# Patient Record
Sex: Male | Born: 1960 | Race: Black or African American | Hispanic: No | Marital: Married | Smoking: Light tobacco smoker
Health system: Southern US, Community
[De-identification: ages and names within clinical notes are randomized; demographics above are authoritative.]

---

## 2018-04-21 ENCOUNTER — Encounter (HOSPITAL_COMMUNITY): Payer: Self-pay | Admitting: Nurse Practitioner

## 2018-04-21 ENCOUNTER — Emergency Department (HOSPITAL_COMMUNITY)
Admission: EM | Admit: 2018-04-21 | Discharge: 2018-04-22 | Disposition: A | Discharge status: Home / Self Care | Payer: Self-pay | Attending: Emergency Medicine | Admitting: Emergency Medicine

## 2018-04-21 DIAGNOSIS — N390 Urinary tract infection, site not specified: Secondary | ICD-10-CM | POA: Insufficient documentation

## 2018-04-21 DIAGNOSIS — R310 Gross hematuria: Secondary | ICD-10-CM | POA: Insufficient documentation

## 2018-04-21 DIAGNOSIS — F17200 Nicotine dependence, unspecified, uncomplicated: Secondary | ICD-10-CM | POA: Insufficient documentation

## 2018-04-21 LAB — COMPREHENSIVE METABOLIC PANEL
ALBUMIN: 3.6 g/dL (ref 3.5–5.0)
ALT: 36 U/L (ref 17–63)
AST: 21 U/L (ref 15–41)
Alkaline Phosphatase: 60 U/L (ref 38–126)
Anion gap: 8 (ref 5–15)
BUN: 18 mg/dL (ref 6–20)
CHLORIDE: 104 mmol/L (ref 101–111)
CO2: 26 mmol/L (ref 22–32)
Calcium: 8.9 mg/dL (ref 8.9–10.3)
Creatinine, Ser: 1.06 mg/dL (ref 0.61–1.24)
GFR calc Af Amer: >60 mL/min (ref >60)
Glucose, Bld: 110 mg/dL — ABNORMAL HIGH (ref 65–99)
POTASSIUM: 3.3 mmol/L — AB (ref 3.5–5.1)
SODIUM: 138 mmol/L (ref 135–145)
Total Bilirubin: 0.3 mg/dL (ref 0.3–1.2)
Total Protein: 7.4 g/dL (ref 6.5–8.1)

## 2018-04-21 LAB — CBC WITH DIFFERENTIAL/PLATELET
BASOS ABS: 0 10*3/uL (ref 0.0–0.1)
BASOS PCT: 0 %
EOS ABS: 0.5 10*3/uL (ref 0.0–0.7)
EOS PCT: 5 %
HCT: 38.3 % — ABNORMAL LOW (ref 39.0–52.0)
Hemoglobin: 12.9 g/dL — ABNORMAL LOW (ref 13.0–17.0)
Lymphocytes Relative: 15 %
Lymphs Abs: 1.6 10*3/uL (ref 0.7–4.0)
MCH: 29.5 pg (ref 26.0–34.0)
MCHC: 33.7 g/dL (ref 30.0–36.0)
MCV: 87.6 fL (ref 78.0–100.0)
Monocytes Absolute: 1.1 10*3/uL — ABNORMAL HIGH (ref 0.1–1.0)
Monocytes Relative: 10 %
Neutro Abs: 7.3 10*3/uL (ref 1.7–7.7)
Neutrophils Relative %: 70 %
PLATELETS: 309 10*3/uL (ref 150–400)
RBC: 4.37 MIL/uL (ref 4.22–5.81)
RDW: 13.3 % (ref 11.5–15.5)
WBC: 10.4 10*3/uL (ref 4.0–10.5)

## 2018-04-21 LAB — URINALYSIS, ROUTINE W REFLEX MICROSCOPIC

## 2018-04-21 LAB — URINALYSIS, MICROSCOPIC (REFLEX): WBC, UA: >50 WBC/hpf (ref 0–5)

## 2018-04-21 MED ORDER — CEPHALEXIN 500 MG PO CAPS: 500.0000 mg | ORAL_CAPSULE | Freq: Four times a day (QID) | ORAL | 0 refills | Status: AC

## 2018-04-21 NOTE — Discharge Instructions (Signed)
It was my pleasure taking care of you today!   Please take all of your antibiotics until finished!  It is very important that you call the urology clinic in the morning and schedule a follow up appointment. Let them know that you had a large amount of blood in your urine and were told to follow up with them for further evaluation of this.   Return to ER for fever, abdominal pain, vomiting, new or worsening symptoms, any additional concerns.

## 2018-04-21 NOTE — ED Triage Notes (Signed)
Pt state he notices a copious amount of blood in his urine earlier today. Denies dysuria or genitourinary problems.

## 2018-04-21 NOTE — ED Provider Notes (Signed)
[facility]-EMERGENCY DEPT Provider Note   CSN: [id number] Arrival date & time: 04/21/18  2013     History   Chief Complaint Chief Complaint  Patient presents with  . Hematuria    HPI Justin Bray is a 57 y.o. male.  The history is provided by the patient and medical records. No language interpreter was used.  Hematuria    Justin Bray is a 57 y.o. male  with no known past medical history who presents to the Emergency Department complaining of hematuria that he first noticed last night.  He denies any associated pain, dysuria, urinary urgency frequency.  No nausea or vomiting.  No fevers.  He states that he went to the bathroom and noticed bright red blood in his urine that felt the toilet bowl.  There were no clots.  He thought it might go away on its own.  Throughout the day today, he had blood with each episode of urination, therefore came to the emergency department to seek further care.  No medications taken prior to arrival for symptoms.  No history of similar.  Not on any anticoagulation.  History reviewed. No pertinent past medical history.  There are no active problems to display for this patient.   History reviewed. No pertinent surgical history.      Home Medications    Prior to Admission medications   Medication Sig Start Date End Date Taking? Authorizing Provider  famotidine-calcium carbonate-magnesium hydroxide (PEPCID COMPLETE) 10-800-165 MG chewable tablet Chew 1 tablet by mouth daily as needed (acid reflux).   Yes [provider]  cephALEXin (KEFLEX) 500 MG capsule Take 1 capsule (500 mg total) by mouth 4 (four) times daily. 04/21/18   Donyale Falcon, Chase PicketJaime Pilcher, PA-C    Family History History reviewed. No pertinent family history.  Social History Social History   Tobacco Use  . Smoking status: Light Tobacco Smoker  . Smokeless tobacco: Never Used  Substance Use Topics  . Alcohol use: Not on file  . Drug use: Not on file      Allergies   Patient has no known allergies.   Review of Systems Review of Systems  Genitourinary: Positive for hematuria. Negative for difficulty urinating, discharge, dysuria, flank pain, frequency, penile pain, penile swelling, scrotal swelling, testicular pain and urgency.  All other systems reviewed and are negative.    Physical Exam Updated Vital Signs BP 107/70   Pulse 97   Temp 98.8 F (37.1 C) (Oral)   Resp 16   Ht 5\' 10"  (1.778 m)   Wt 88.4 kg (194 lb 12.8 oz)   SpO2 97%   BMI 27.95 kg/m   Physical Exam  Constitutional: He is oriented to person, place, and time. He appears well-developed and well-nourished. No distress.  HENT:  Head: Normocephalic and atraumatic.  Neck: Neck supple.  Cardiovascular: Normal rate, regular rhythm and normal heart sounds.  No murmur heard. Pulmonary/Chest: Effort normal and breath sounds normal. No respiratory distress.  Abdominal: Soft. He exhibits no distension.  No abdominal, flank or CVA tenderness.  Neurological: He is alert and oriented to person, place, and time.  Skin: Skin is warm and dry.  Nursing note and vitals reviewed.    ED Treatments / Results  Labs (all labs ordered are listed, but only abnormal results are displayed) Labs Reviewed  URINALYSIS, ROUTINE W REFLEX MICROSCOPIC - Abnormal; Notable for the following components:      Result Value   Color, Urine RED (*)    APPearance TURBID (*)  Glucose, UA   (*)    Value: TEST NOT REPORTED DUE TO COLOR INTERFERENCE OF URINE PIGMENT   Hgb urine dipstick   (*)    Value: TEST NOT REPORTED DUE TO COLOR INTERFERENCE OF URINE PIGMENT   Bilirubin Urine   (*)    Value: TEST NOT REPORTED DUE TO COLOR INTERFERENCE OF URINE PIGMENT   Ketones, ur   (*)    Value: TEST NOT REPORTED DUE TO COLOR INTERFERENCE OF URINE PIGMENT   Protein, ur   (*)    Value: TEST NOT REPORTED DUE TO COLOR INTERFERENCE OF URINE PIGMENT   Nitrite   (*)    Value: TEST NOT REPORTED DUE TO  COLOR INTERFERENCE OF URINE PIGMENT   Leukocytes, UA   (*)    Value: TEST NOT REPORTED DUE TO COLOR INTERFERENCE OF URINE PIGMENT   All other components within normal limits  CBC WITH DIFFERENTIAL/PLATELET - Abnormal; Notable for the following components:   Hemoglobin 12.9 (*)    HCT 38.3 (*)    Monocytes Absolute 1.1 (*)    All other components within normal limits  COMPREHENSIVE METABOLIC PANEL - Abnormal; Notable for the following components:   Potassium 3.3 (*)    Glucose, Bld 110 (*)    All other components within normal limits  URINALYSIS, MICROSCOPIC (REFLEX) - Abnormal; Notable for the following components:   Bacteria, UA FEW (*)    All other components within normal limits  URINE CULTURE    EKG None  Radiology No results found.  Procedures Procedures (including critical care time)  Medications Ordered in ED Medications - No data to display   Initial Impression / Assessment and Plan / ED Course  I have reviewed the triage vital signs and the nursing notes.  Pertinent labs & imaging results that were available during my care of the patient were reviewed by me and considered in my medical decision making (see chart for details).    Justin Bray is a 57 y.o. male who presents to ED for pain with hematuria which he noticed yesterday and has continued throughout the day today.  No abdominal, flank or CVA tenderness.  He is afebrile and hemodynamically stable.  Normal kidney function.  Hemoglobin stable.  UA difficult to interpret due to pigmentation, however microscopy does show greater than 50 WBCs and greater than 50 RBCs.  Urine sent for culture.  Will treat with Keflex.  Discussed with patient and his wife at length about the importance of urology follow-up for further evaluation of gross painless hematuria.  Wife agrees to call urology in the morning to schedule appointment.  Referral information provided.  Reasons to return to the ER were discussed and included in  discharge paperwork.  All questions answered.  Patient discussed with Dr. Deretha Emory who agrees with treatment plan.   Final Clinical Impressions(s) / ED Diagnoses   Final diagnoses:  Lower urinary tract infectious disease  Gross hematuria    ED Discharge Orders        Ordered    cephALEXin (KEFLEX) 500 MG capsule  4 times daily     04/21/18 2311       Lexie Koehl, Chase Picket, PA-C 04/21/18 2319    Vanetta Mulders, MD 04/24/18 2048

## 2018-04-23 LAB — URINE CULTURE: Culture: NO GROWTH

## 2022-03-24 ENCOUNTER — Other Ambulatory Visit: Payer: Self-pay

## 2022-03-24 ENCOUNTER — Encounter (HOSPITAL_BASED_OUTPATIENT_CLINIC_OR_DEPARTMENT_OTHER): Payer: Self-pay | Admitting: Emergency Medicine

## 2022-03-24 ENCOUNTER — Emergency Department (HOSPITAL_BASED_OUTPATIENT_CLINIC_OR_DEPARTMENT_OTHER)
Admission: EM | Admit: 2022-03-24 | Discharge: 2022-03-24 | Disposition: A | Discharge status: Home / Self Care | Payer: Managed Care, Other (non HMO) | Attending: Emergency Medicine | Admitting: Emergency Medicine

## 2022-03-24 ENCOUNTER — Emergency Department (HOSPITAL_BASED_OUTPATIENT_CLINIC_OR_DEPARTMENT_OTHER): Payer: Managed Care, Other (non HMO)

## 2022-03-24 DIAGNOSIS — M25571 Pain in right ankle and joints of right foot: Secondary | ICD-10-CM | POA: Diagnosis present

## 2022-03-24 MED ORDER — IBUPROFEN 600 MG PO TABS: 600.0000 mg | ORAL_TABLET | Freq: Four times a day (QID) | ORAL | 0 refills | Status: AC | PRN

## 2022-03-24 MED ORDER — OXYCODONE HCL 5 MG PO TABS: 5.0000 mg | ORAL_TABLET | Freq: Four times a day (QID) | ORAL | 0 refills | Status: AC | PRN

## 2022-03-24 MED ORDER — HYDROCODONE-ACETAMINOPHEN 5-325 MG PO TABS
1.0000 | ORAL_TABLET | Freq: Once | ORAL | Status: AC
  Administered 2022-03-24: 1 via ORAL
  Filled 2022-03-24: qty 1

## 2022-03-24 NOTE — ED Notes (Signed)
Patient verbalizes understanding of discharge instructions. Opportunity for questioning and answers were provided. Patient discharged from ED.  °

## 2022-03-24 NOTE — ED Provider Notes (Signed)
[facility] EMERGENCY DEPT Provider Note   CSN: [id number] Arrival date & time: 03/24/22  3875     History  Chief Complaint  Patient presents with   Ankle Pain    Justin Bray is a 61 y.o. male.  Pt is a 61 yo male presenting for right sided ankle pain. Pt states he tripped and fell down two steps yesterday injurying the outside of his right ankle. Denies any motor deficits or sensation deficits. Denies head trauma, LOC, or blood thinner use.   The history is provided by the patient. No language interpreter was used.  Ankle Pain Associated symptoms: no back pain and no fever       Home Medications Prior to Admission medications   Medication Sig Start Date End Date Taking? Authorizing Provider  ibuprofen (ADVIL) 600 MG tablet Take 1 tablet (600 mg total) by mouth every 6 (six) hours as needed. 03/24/22  Yes Edwin Dada P, DO  oxyCODONE (ROXICODONE) 5 MG immediate release tablet Take 1 tablet (5 mg total) by mouth every 6 (six) hours as needed for severe pain. 03/24/22  Yes Edwin Dada P, DO  cephALEXin (KEFLEX) 500 MG capsule Take 1 capsule (500 mg total) by mouth 4 (four) times daily. 04/21/18   Ward, Chase Picket, PA-C  famotidine-calcium carbonate-magnesium hydroxide (PEPCID COMPLETE) 10-800-165 MG chewable tablet Chew 1 tablet by mouth daily as needed (acid reflux).    [provider]      Allergies    Patient has no known allergies.    Review of Systems   Review of Systems  Constitutional:  Negative for chills and fever.  HENT:  Negative for ear pain and sore throat.   Eyes:  Negative for pain and visual disturbance.  Respiratory:  Negative for cough and shortness of breath.   Cardiovascular:  Negative for chest pain and palpitations.  Gastrointestinal:  Negative for abdominal pain and vomiting.  Genitourinary:  Negative for dysuria and hematuria.  Musculoskeletal:  Negative for arthralgias and back pain.  Skin:  Negative for color change and  rash.  Neurological:  Negative for seizures and syncope.  All other systems reviewed and are negative.  Physical Exam Updated Vital Signs BP (!) 142/94 (BP Location: Right Arm)   Pulse 78   Temp 98.1 F (36.7 C) (Oral)   Resp 18   Ht 5\' 10"  (1.778 m)   Wt 91.2 kg   SpO2 100%   BMI 28.84 kg/m  Physical Exam Vitals and nursing note reviewed.  Constitutional:      Appearance: Normal appearance.  Cardiovascular:     Rate and Rhythm: Normal rate and regular rhythm.     Pulses:          Dorsalis pedis pulses are 2+ on the right side and 2+ on the left side.  Pulmonary:     Effort: Pulmonary effort is normal.     Breath sounds: Normal breath sounds.  Skin:    Capillary Refill: Capillary refill takes less than 2 seconds.  Neurological:     General: No focal deficit present.     Mental Status: He is alert.     GCS: GCS eye subscore is 4. GCS verbal subscore is 5. GCS motor subscore is 6.     Sensory: Sensation is intact.     Motor: Motor function is intact.    ED Results / Procedures / Treatments   Labs (all labs ordered are listed, but only abnormal results are displayed) Labs Reviewed - No  data to display  EKG None  Radiology DG Ankle Complete Right  Result Date: 03/24/2022 CLINICAL DATA:  Right ankle pain after fall. EXAM: RIGHT ANKLE - COMPLETE 3+ VIEW COMPARISON:  None Available. FINDINGS: Minimal degenerative changes of the ankle mortise. No acute fracture or dislocation. No focal soft tissue abnormality. Minimal degenerate change over the dorsal talonavicular joint. Small inferior calcaneal spur. IMPRESSION: No acute findings. Electronically Signed   By: Elberta Fortis M.D.   On: 03/24/2022 08:28    Procedures Procedures    Medications Ordered in ED Medications  HYDROcodone-acetaminophen (NORCO/VICODIN) 5-325 MG per tablet 1 tablet (1 tablet Oral Given 03/24/22 0177)    ED Course/ Medical Decision Making/ A&P                           Medical Decision  Making Amount and/or Complexity of Data Reviewed Radiology: ordered.  Risk Prescription drug management.   8:38 AM 61 yo male presenting for right sided ankle pain.  Patient is alert and oriented x3, no acute distress, afebrile, with stable vital signs.  Physical exam demonstrates swelling of the right lateral malleolus.  Tenderness to palpation.  Leg neurovascularly intact.  No other signs of trauma.  X-ray demonstrates no fractures.  Medication given for pain control, Ace wrap applied, and crutches given with teaching.  Patient recommended for rest, elevation, and ice.  Motrin and Tylenol for minor pain and Norco for severe pain.  Patient in no distress and overall condition improved here in the ED. Detailed discussions were had with the patient regarding current findings, and need for close f/u with orthopedic specialist. The patient has been instructed to return immediately if the symptoms worsen in any way for re-evaluation. Patient verbalized understanding and is in agreement with current care plan. All questions answered prior to discharge.         Final Clinical Impression(s) / ED Diagnoses Final diagnoses:  Acute right ankle pain    Rx / DC Orders ED Discharge Orders          Ordered    ibuprofen (ADVIL) 600 MG tablet  Every 6 hours PRN        03/24/22 0837    oxyCODONE (ROXICODONE) 5 MG immediate release tablet  Every 6 hours PRN        03/24/22 0837              Franne Forts, DO 03/24/22 [phone]

## 2022-03-24 NOTE — ED Triage Notes (Signed)
Pt had a mechanical fall yesterday as he was going down the stairs. Pt fell to his knees and then onto his right elbow. Pt co/o pain to his right ankle.

## 2022-03-24 NOTE — Discharge Instructions (Signed)
Rest, elevated leg, ice, and Motrin/Tylenol for pain and inflammation. Oxycodone sent to pharmacy for severe pain. Use crutches as needed. Follow up with orthopedics if pain does not resolve in 1-2 weeks of rest.

## 2023-05-12 IMAGING — DX DG ANKLE COMPLETE 3+V*R*
3 series · 3 of 3 positions shown · non-contrast
Comparison: None Available.

CLINICAL DATA: Right ankle pain after fall.

EXAM:
RIGHT ANKLE - COMPLETE 3+ VIEW

[ankle ap]
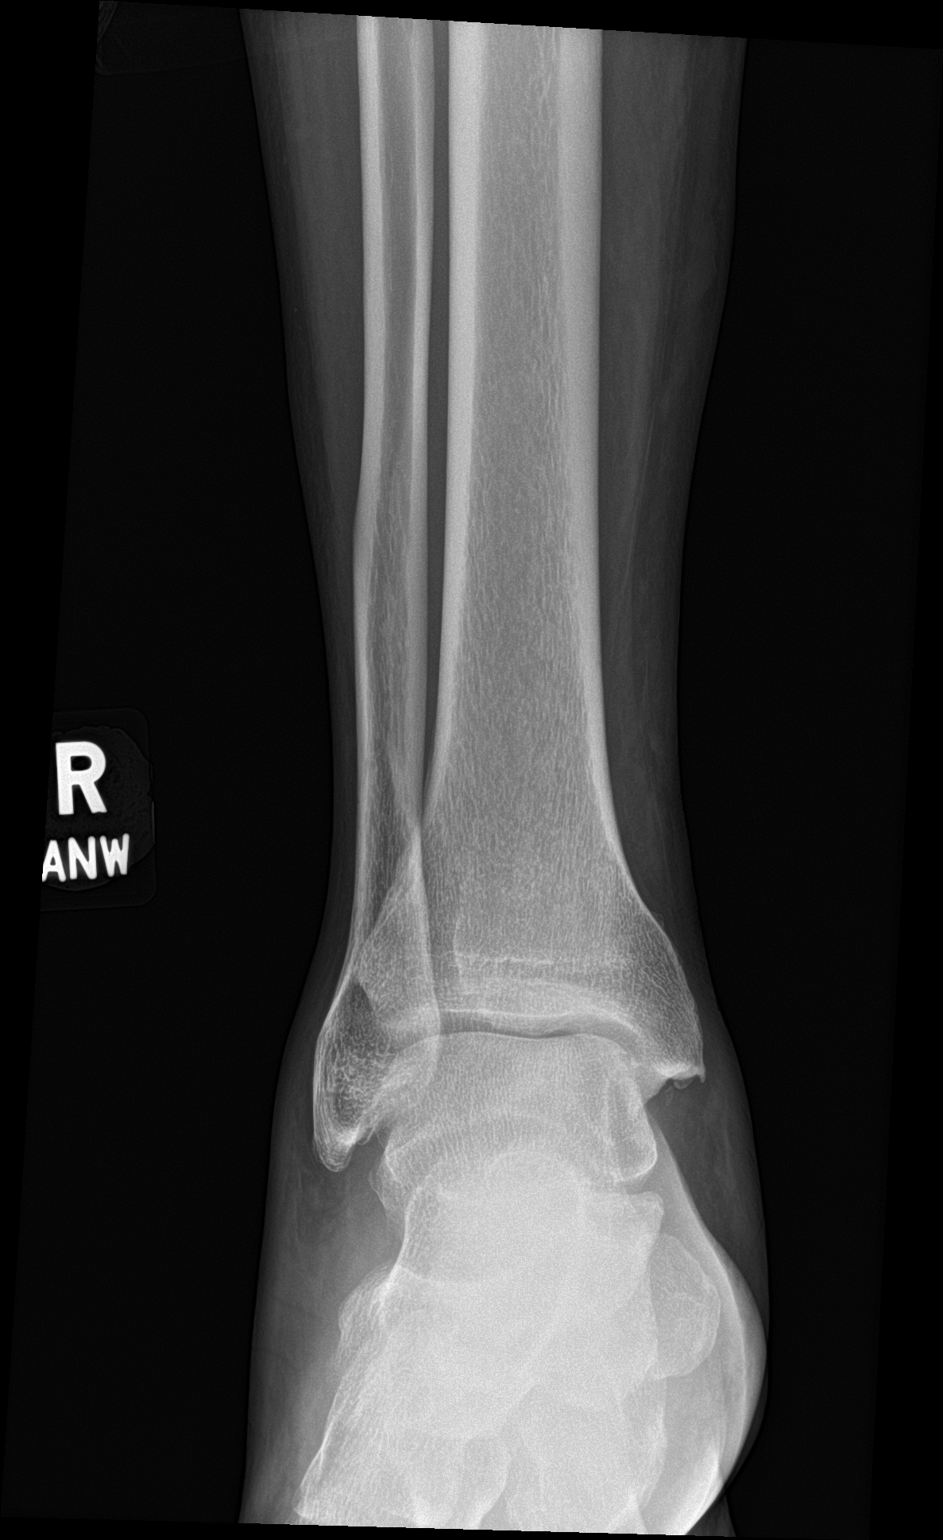

[ankle obl]
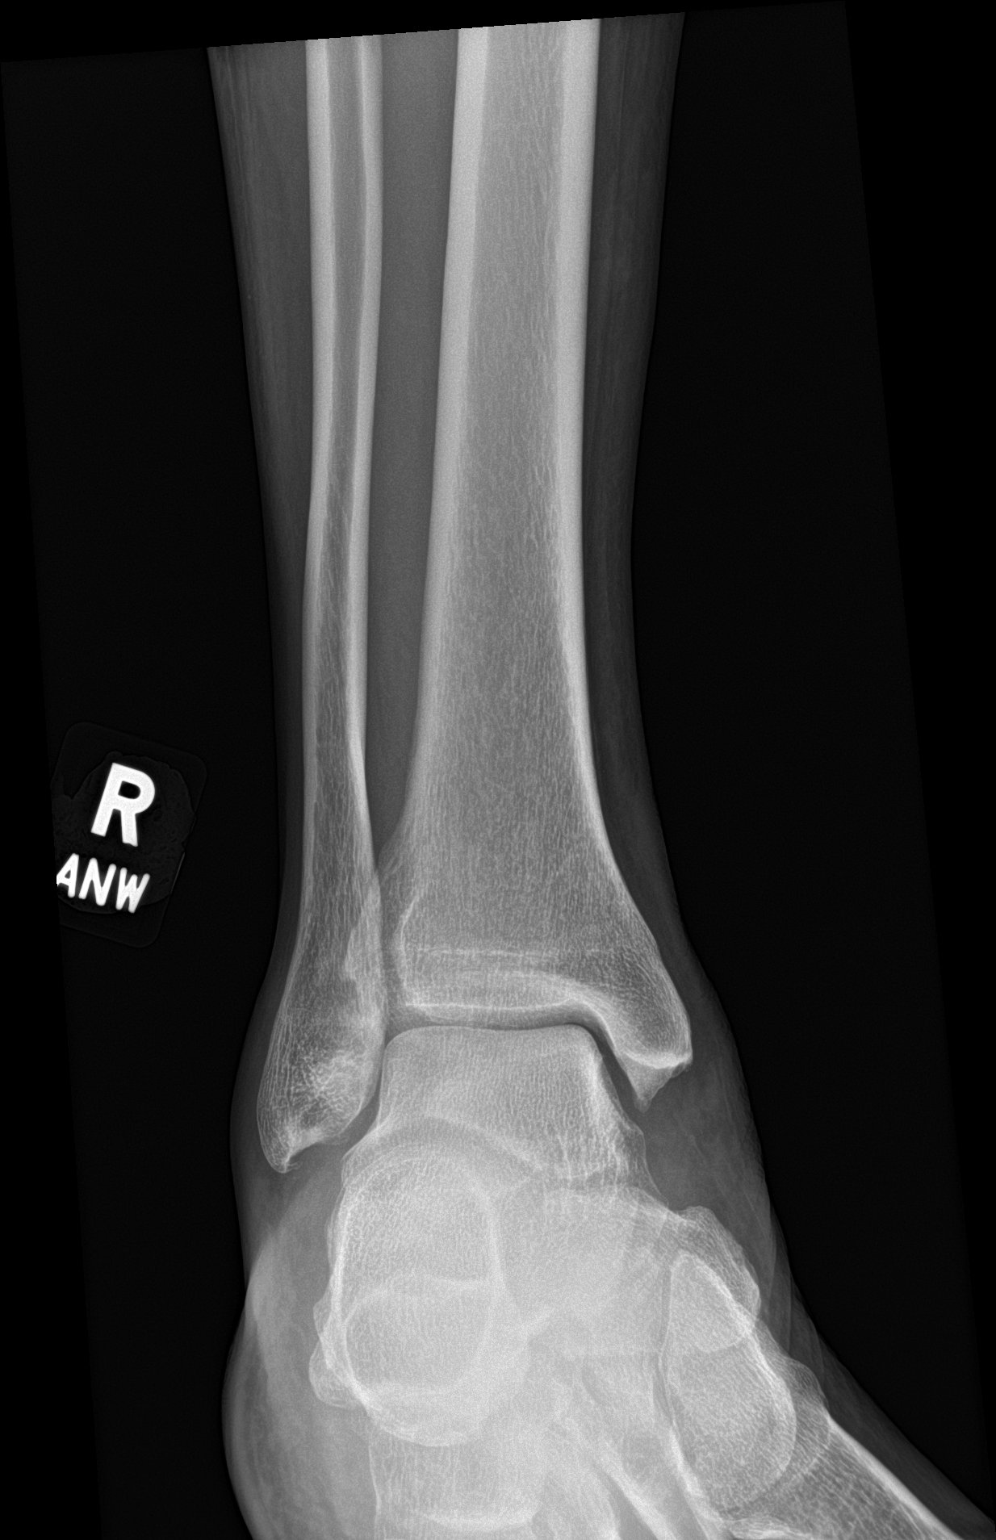

[ankle lat]
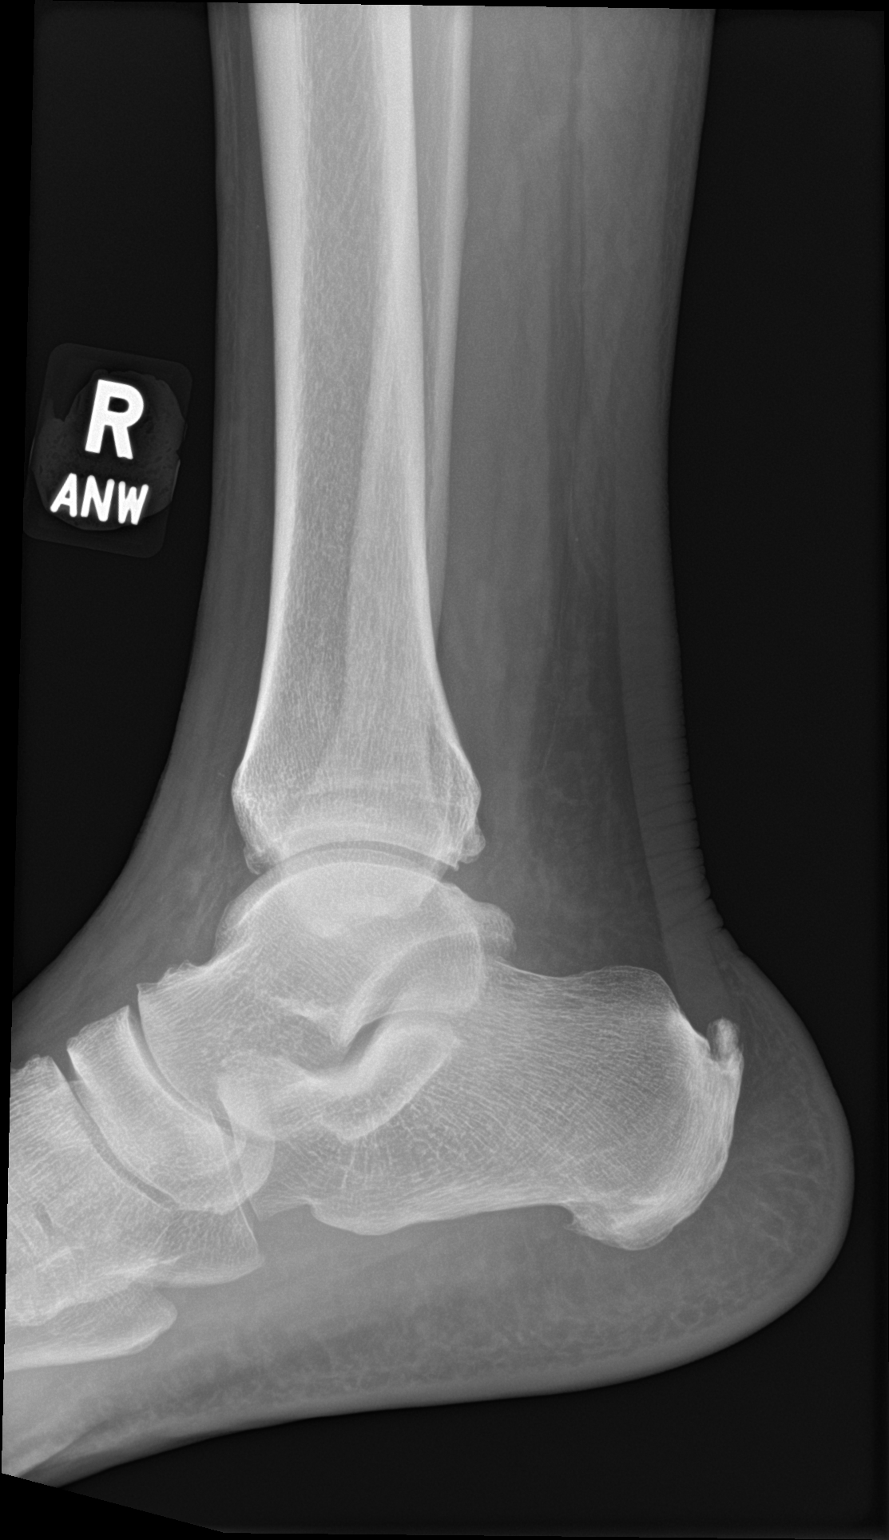

[3 of 3 positions shown; findings below may reference images not displayed]

FINDINGS: Minimal degenerative changes of the ankle mortise. No acute fracture
or dislocation. No focal soft tissue abnormality. Minimal degenerate
change over the dorsal talonavicular joint. Small inferior calcaneal
spur.
IMPRESSION: No acute findings.
